# Patient Record
Sex: Male | Born: 1945 | Race: Black or African American | Hispanic: No | Marital: Married | Smoking: Former smoker
Health system: Southern US, Community
[De-identification: ages and names within clinical notes are randomized; demographics above are authoritative.]

## PROBLEM LIST (undated history)

## (undated) DIAGNOSIS — K219 Gastro-esophageal reflux disease without esophagitis: Secondary | ICD-10-CM

## (undated) DIAGNOSIS — H3320 Serous retinal detachment, unspecified eye: Secondary | ICD-10-CM

## (undated) DIAGNOSIS — H2640 Unspecified secondary cataract: Secondary | ICD-10-CM

## (undated) DIAGNOSIS — C61 Malignant neoplasm of prostate: Secondary | ICD-10-CM

## (undated) DIAGNOSIS — E11319 Type 2 diabetes mellitus with unspecified diabetic retinopathy without macular edema: Secondary | ICD-10-CM

## (undated) DIAGNOSIS — E119 Type 2 diabetes mellitus without complications: Secondary | ICD-10-CM

## (undated) DIAGNOSIS — E538 Deficiency of other specified B group vitamins: Secondary | ICD-10-CM

## (undated) DIAGNOSIS — H539 Unspecified visual disturbance: Secondary | ICD-10-CM

## (undated) DIAGNOSIS — R809 Proteinuria, unspecified: Secondary | ICD-10-CM

## (undated) DIAGNOSIS — E11649 Type 2 diabetes mellitus with hypoglycemia without coma: Secondary | ICD-10-CM

## (undated) DIAGNOSIS — I10 Essential (primary) hypertension: Secondary | ICD-10-CM

## (undated) DIAGNOSIS — R972 Elevated prostate specific antigen [PSA]: Principal | ICD-10-CM

## (undated) DIAGNOSIS — Z961 Presence of intraocular lens: Secondary | ICD-10-CM

## (undated) DIAGNOSIS — E785 Hyperlipidemia, unspecified: Secondary | ICD-10-CM

## (undated) DIAGNOSIS — N529 Male erectile dysfunction, unspecified: Secondary | ICD-10-CM

## (undated) HISTORY — DX: Type 2 diabetes mellitus without complications: E11.9

## (undated) HISTORY — DX: Essential (primary) hypertension: I10

## (undated) HISTORY — DX: Unspecified secondary cataract: H26.40

## (undated) HISTORY — DX: Serous retinal detachment, unspecified eye: H33.20

## (undated) HISTORY — DX: Type 2 diabetes mellitus with hypoglycemia without coma: E11.649

## (undated) HISTORY — DX: Type 2 diabetes mellitus with unspecified diabetic retinopathy without macular edema: E11.319

## (undated) HISTORY — DX: Proteinuria, unspecified: R80.9

## (undated) HISTORY — DX: Elevated prostate specific antigen (PSA): R97.20

## (undated) HISTORY — PX: COLONOSCOPY: SHX174

## (undated) HISTORY — DX: Hyperlipidemia, unspecified: E78.5

## (undated) HISTORY — DX: Presence of intraocular lens: Z96.1

## (undated) HISTORY — DX: Deficiency of other specified B group vitamins: E53.8

---

## 2008-01-04 ENCOUNTER — Other Ambulatory Visit: Payer: Self-pay

## 2008-01-04 ENCOUNTER — Emergency Department: Payer: Self-pay | Admitting: Emergency Medicine

## 2008-01-05 ENCOUNTER — Ambulatory Visit: Payer: Self-pay | Admitting: Specialist

## 2008-11-10 HISTORY — PX: RETINAL DETACHMENT SURGERY: SHX105

## 2009-11-10 HISTORY — PX: EYE SURGERY: SHX253

## 2010-05-24 ENCOUNTER — Ambulatory Visit: Payer: Self-pay | Admitting: Ophthalmology

## 2010-09-04 ENCOUNTER — Ambulatory Visit: Payer: Self-pay | Admitting: Ophthalmology

## 2010-12-11 ENCOUNTER — Ambulatory Visit: Payer: Self-pay | Admitting: Ophthalmology

## 2010-12-12 ENCOUNTER — Ambulatory Visit: Payer: Self-pay | Admitting: Ophthalmology

## 2012-03-21 HISTORY — PX: EYE SURGERY: SHX253

## 2013-03-17 DIAGNOSIS — E785 Hyperlipidemia, unspecified: Secondary | ICD-10-CM

## 2013-03-17 DIAGNOSIS — I10 Essential (primary) hypertension: Secondary | ICD-10-CM

## 2013-03-17 DIAGNOSIS — E139 Other specified diabetes mellitus without complications: Secondary | ICD-10-CM | POA: Insufficient documentation

## 2013-03-17 HISTORY — DX: Essential (primary) hypertension: I10

## 2013-03-17 HISTORY — DX: Hyperlipidemia, unspecified: E78.5

## 2013-03-21 HISTORY — PX: EYE SURGERY: SHX253

## 2013-03-22 DIAGNOSIS — Z961 Presence of intraocular lens: Secondary | ICD-10-CM

## 2013-03-22 HISTORY — DX: Presence of intraocular lens: Z96.1

## 2013-04-07 DIAGNOSIS — H3320 Serous retinal detachment, unspecified eye: Secondary | ICD-10-CM

## 2013-04-07 HISTORY — DX: Serous retinal detachment, unspecified eye: H33.20

## 2013-11-10 HISTORY — PX: PSEUDOANEURYSM REPAIR: SHX2272

## 2014-01-17 DIAGNOSIS — H2640 Unspecified secondary cataract: Secondary | ICD-10-CM

## 2014-01-17 HISTORY — DX: Unspecified secondary cataract: H26.40

## 2014-06-29 DIAGNOSIS — E119 Type 2 diabetes mellitus without complications: Secondary | ICD-10-CM

## 2014-06-29 DIAGNOSIS — Z794 Long term (current) use of insulin: Secondary | ICD-10-CM | POA: Insufficient documentation

## 2014-06-29 DIAGNOSIS — R809 Proteinuria, unspecified: Secondary | ICD-10-CM

## 2014-06-29 DIAGNOSIS — E11649 Type 2 diabetes mellitus with hypoglycemia without coma: Secondary | ICD-10-CM

## 2014-06-29 HISTORY — DX: Type 2 diabetes mellitus with hypoglycemia without coma: E11.649

## 2014-06-29 HISTORY — DX: Type 2 diabetes mellitus without complications: E11.9

## 2014-06-29 HISTORY — DX: Proteinuria, unspecified: R80.9

## 2014-09-21 DIAGNOSIS — E11319 Type 2 diabetes mellitus with unspecified diabetic retinopathy without macular edema: Secondary | ICD-10-CM

## 2014-09-21 HISTORY — DX: Type 2 diabetes mellitus with unspecified diabetic retinopathy without macular edema: E11.319

## 2014-11-24 ENCOUNTER — Ambulatory Visit: Payer: Self-pay | Admitting: Gastroenterology

## 2015-03-05 LAB — SURGICAL PATHOLOGY

## 2017-03-17 DIAGNOSIS — R972 Elevated prostate specific antigen [PSA]: Secondary | ICD-10-CM

## 2017-03-17 HISTORY — DX: Elevated prostate specific antigen (PSA): R97.20

## 2017-09-30 ENCOUNTER — Other Ambulatory Visit: Payer: Self-pay | Admitting: Family Medicine

## 2017-09-30 DIAGNOSIS — Z136 Encounter for screening for cardiovascular disorders: Secondary | ICD-10-CM

## 2017-09-30 DIAGNOSIS — Z87891 Personal history of nicotine dependence: Secondary | ICD-10-CM

## 2017-10-07 ENCOUNTER — Ambulatory Visit
Admission: RE | Admit: 2017-10-07 | Discharge: 2017-10-07 | Disposition: A | Discharge status: Home / Self Care | Payer: Medicare HMO | Source: Ambulatory Visit | Attending: Family Medicine | Admitting: Family Medicine

## 2017-10-07 DIAGNOSIS — Z87891 Personal history of nicotine dependence: Secondary | ICD-10-CM

## 2017-10-07 DIAGNOSIS — Z136 Encounter for screening for cardiovascular disorders: Secondary | ICD-10-CM

## 2017-10-23 ENCOUNTER — Encounter: Payer: Self-pay | Admitting: Urology

## 2017-10-23 ENCOUNTER — Ambulatory Visit (INDEPENDENT_AMBULATORY_CARE_PROVIDER_SITE_OTHER): Payer: Medicare HMO | Admitting: Urology

## 2017-10-23 VITALS — BP 115/65 | HR 75 | Ht 69.0 in | Wt 174.0 lb

## 2017-10-23 DIAGNOSIS — N402 Nodular prostate without lower urinary tract symptoms: Secondary | ICD-10-CM | POA: Diagnosis not present

## 2017-10-23 DIAGNOSIS — E538 Deficiency of other specified B group vitamins: Secondary | ICD-10-CM

## 2017-10-23 DIAGNOSIS — R972 Elevated prostate specific antigen [PSA]: Secondary | ICD-10-CM | POA: Diagnosis not present

## 2017-10-23 HISTORY — DX: Deficiency of other specified B group vitamins: E53.8

## 2017-10-23 NOTE — Progress Notes (Signed)
10/23/2017 3:00 PM   Jonathan Gonzales 02-19-1946 [id number]  Referring provider: Alanson Aly, [place], [place] [address]  Chief Complaint  Patient presents with  . Elevated PSA    New Patient    HPI: 71 year old male referred for further evaluation of elevated PSA.  He underwent routine annual PSA 5/18 & his PSA was 4.57.  He underwent subsequent repeat PSA in 09/2017 which time his PSA had risen to 5.54.  He does not previously recall being told his PSA is elevated.  He has no previous PSAs for review.  He has not had a rectal exam in quite some time.     He denies any baseline urinary symptoms.  He feels like he is an excellent stream is able to empty completely.  No urgency or frequency.  No nocturia.  No UTIs or gross hematuria.  No weight loss or bone pain.  No family history of prostate cancer.    His primary medical problems include DM, HL.  He takes ASA 325 mg for prophylaxis.     PMH: Past Medical History:  Diagnosis Date  . After cataract 01/17/2014   Last Assessment & Plan:  Discussed rba of yag cap laser, pt interesting in proceeding today Pre-cert done, informed conset obtained and info dispensed to pt and wife Uncomplicated yag done today  PF BID OD x 2 weeks  rtc iop check and vision check 2 weeks  Last Assessment & Plan:  2 weeks s/p PCIOL Very happy with vision OD and no sequelae  Pt asks to have OS looked at [facility] in detail (last seen M  . Diabetic retinopathy associated with type 2 diabetes mellitus ([place]) 09/21/2014  . Elevated PSA 03/17/2017  . Hyperlipidemia, unspecified 03/17/2013   Overview:  Treated with atorvastatin  . Hypertension 03/17/2013   Overview:  Treated with amlodipine, metoprolol, and valsartan-HCTZ, diagnosed 1980's.  . Hypoglycemia associated with diabetes ([place]) 06/29/2014  . Microalbuminuria 06/29/2014  . Pseudophakia, both eyes 03/22/2013  . Retinal detachment 04/07/2013   Overview:  S/p PPV OS 2011  . Type II or  unspecified type diabetes mellitus without mention of complication, not stated as uncontrolled 06/29/2014  . Vitamin B12 deficiency 10/23/2017    Surgical History: Past Surgical History:  Procedure Laterality Date  . PSEUDOANEURYSM REPAIR  2015  . RETINAL DETACHMENT SURGERY  2010    Home Medications:  Allergies as of 10/23/2017   No Known Allergies     Medication List        Accurate as of 10/23/17  3:00 PM. Always use your most recent med list.          amLODipine 5 MG tablet Commonly known as:  NORVASC TK 1 T PO QD   aspirin EC 325 MG tablet Take by mouth.   atorvastatin 40 MG tablet Commonly known as:  LIPITOR TK 1 T PO QD   metFORMIN 1000 MG tablet Commonly known as:  GLUCOPHAGE TAKE 1 TABLET BY MOUTH TWICE DAILY WITH MEALS   metoprolol succinate 100 MG 24 hr tablet Commonly known as:  TOPROL-XL TK 1 T PO QD   NOVOLOG MIX 70/30 FLEXPEN (70-30) 100 UNIT/ML FlexPen Generic drug:  insulin aspart protamine - aspart INJ 23 UNITS [place] BID   valsartan-hydrochlorothiazide 160-25 MG tablet Commonly known as:  DIOVAN-HCT TK 1 T PO QD       Allergies: No Known Allergies  Family History: Family History  Problem Relation Age of Onset  . Diabetes  Mother   . Heart failure Mother   . Colon cancer Father   . Hematuria Sister   . Kidney failure Sister     Social History:  reports that he has quit smoking. he has never used smokeless tobacco. He reports that he drinks alcohol. He reports that he does not use drugs.  ROS: UROLOGY Frequent Urination?: No Hard to postpone urination?: No Burning/pain with urination?: No Get up at night to urinate?: No Leakage of urine?: No Urine stream starts and stops?: Yes Trouble starting stream?: No Do you have to strain to urinate?: No Blood in urine?: No Urinary tract infection?: No Sexually transmitted disease?: No Injury to kidneys or bladder?: No Painful intercourse?: No Weak stream?: No Erection problems?:  No Penile pain?: No  Gastrointestinal Nausea?: No Vomiting?: No Indigestion/heartburn?: Yes Diarrhea?: No Constipation?: No  Constitutional Fever: No Night sweats?: No Weight loss?: No Fatigue?: No  Skin Skin rash/lesions?: No Itching?: No  Eyes Blurred vision?: Yes Double vision?: No  Ears/Nose/Throat Sore throat?: No Sinus problems?: No  Hematologic/Lymphatic Swollen glands?: No Easy bruising?: Yes  Cardiovascular Leg swelling?: No Chest pain?: No  Respiratory Cough?: No Shortness of breath?: No  Endocrine Excessive thirst?: No  Musculoskeletal Back pain?: Yes Joint pain?: Yes  Neurological Headaches?: No Dizziness?: No  Psychologic Depression?: No Anxiety?: No  Physical Exam: BP 115/65   Pulse 75   Ht 5\' 9"  (1.753 m)   Wt 174 lb (78.9 kg)   BMI 25.70 kg/m   Constitutional:  Alert and oriented, No acute distress. HEENT: [place] AT, moist mucus membranes.  Trachea midline, no masses. Cardiovascular: No clubbing, cyanosis, or edema. Respiratory: Normal respiratory effort, no increased work of breathing. GI: Abdomen is soft, nontender, nondistended, no abdominal masses GU: No CVA tenderness.   Bowel: Normal sphincter tone.  Enlarged 40+ cc prostate with nodule at left base as well as small nodule at midline base. Skin: No rashes, bruises or suspicious lesions. Neurologic: Grossly intact, no focal deficits, moving all 4 extremities. Psychiatric: Normal mood and affect.  Laboratory Data: Lab 09/2017 Cr 1.1 Hbg 13.2 A1c 6.8 PSA as above  Urinalysis  n/a  Pertinent Imaging: N/a  Assessment & Plan:    1. Elevated PSA  We reviewed the implications of an elevated PSA and the uncertainty surrounding it. In general, a man's PSA increases with age and is produced by both normal and cancerous prostate tissue. Differential for elevated PSA is BPH, prostate cancer, infection, recent intercourse/ejaculation, prostate infarction, recent  urethroscopic manipulation (foley placement/cystoscopy) and prostatitis. Management of an elevated PSA can include observation or prostate biopsy and wediscussed this in detail. We discussed that indications for prostate biopsy are defined by age and race specific PSA cutoffs as well as a PSA velocity of 0.75/year.  Given his elevated and rising PSA along with abnormal DRE, I would strongly recommend prostate biopsy.  We discussed prostate biopsy in detail including the procedure itself, the risks of blood in the urine, stool, and ejaculate, serious infection, and discomfort. He is willing to proceed with this as discussed.  He will hold his aspirin for procedure  2. Prostate nodule As above   Schedule prostate biopsy  Hollice Espy, MD  [facility] [address] [place], [place] [address] [phone]

## 2017-11-24 ENCOUNTER — Other Ambulatory Visit: Payer: Self-pay | Admitting: Urology

## 2017-11-24 ENCOUNTER — Ambulatory Visit: Payer: Medicare HMO | Admitting: Urology

## 2017-11-24 ENCOUNTER — Encounter: Payer: Self-pay | Admitting: Urology

## 2017-11-24 VITALS — BP 148/54 | HR 81 | Ht 69.0 in | Wt 175.0 lb

## 2017-11-24 DIAGNOSIS — R972 Elevated prostate specific antigen [PSA]: Secondary | ICD-10-CM

## 2017-11-24 DIAGNOSIS — N402 Nodular prostate without lower urinary tract symptoms: Secondary | ICD-10-CM

## 2017-11-24 MED ORDER — GENTAMICIN SULFATE 40 MG/ML IJ SOLN
80.0000 mg | Freq: Once | INTRAMUSCULAR | Status: AC
  Administered 2017-11-24: 80 mg via INTRAMUSCULAR

## 2017-11-24 MED ORDER — LEVOFLOXACIN 500 MG PO TABS
500.0000 mg | ORAL_TABLET | Freq: Once | ORAL | Status: AC
  Administered 2017-11-24: 500 mg via ORAL

## 2017-11-24 NOTE — Progress Notes (Signed)
   11/24/17  CC:  Chief Complaint  Patient presents with  . Prostate Biopsy    HPI: 72 year old male with an elevated PSA to 5.54.  He also has a prostate nodule at the left base and in the midline base highly concerning for prostate cancer.  He presents to the office today for biopsy.  Blood pressure (!) 148/54, pulse 81, height 5\' 9"  (1.753 m), weight 175 lb (79.4 kg). NED. A&Ox3.   No respiratory distress   Abd soft, NT, ND Normal sphincter tone  Prostate Biopsy Procedure   Informed consent was obtained after discussing risks/benefits of the procedure.  A time out was performed to ensure correct patient identity.  Pre-Procedure: - Gentamicin given prophylactically - Levaquin 500 mg administered PO -Transrectal Ultrasound performed revealing a 53 gm prostate -Several hypoechoic lesions diffusely throughout the prostate.  No discrete median lobe.  Procedure: - Prostate block performed using 10 cc 1% lidocaine and biopsies taken from sextant areas, a total of 12 under ultrasound guidance.  Post-Procedure: - Patient tolerated the procedure well - He was counseled to seek immediate medical attention if experiences any severe pain, significant bleeding, or fevers - Return in one week to discuss biopsy results  Assessment/ Plan:  1. Elevated PSA As above Return to care to discuss results - levofloxacin (LEVAQUIN) tablet 500 mg - gentamicin (GARAMYCIN) injection 80 mg  2. Prostate nodule As above   Hollice Espy, MD

## 2017-11-30 ENCOUNTER — Other Ambulatory Visit: Payer: Self-pay | Admitting: Urology

## 2017-12-01 LAB — PATHOLOGY REPORT

## 2017-12-04 ENCOUNTER — Encounter: Payer: Self-pay | Admitting: Urology

## 2017-12-04 ENCOUNTER — Ambulatory Visit (INDEPENDENT_AMBULATORY_CARE_PROVIDER_SITE_OTHER): Payer: Medicare HMO | Admitting: Urology

## 2017-12-04 VITALS — BP 123/67 | HR 70 | Ht 69.0 in | Wt 175.0 lb

## 2017-12-04 DIAGNOSIS — C61 Malignant neoplasm of prostate: Secondary | ICD-10-CM

## 2017-12-04 NOTE — Progress Notes (Signed)
12/04/2017 4:41 PM   Jonathan Gonzales [birth year] [id number]  Referring provider: Alanson Aly, [place], [place] [address]  Chief Complaint  Patient presents with  . Prostate Cancer    biopsy results    HPI: 72 year old male who returns today to the office to discuss his new diagnosis of prostate cancer.  He initially presented with an elevated PSA to 5.54.  He did have an abnormal rectal exam with a suspicious nodule.  TRUS volume 53 g.  He underwent prostate biopsy on 11/25/2017 revealing 3 cores of Gleason 3+3 up to 10%.  It was located at the apex bilaterally as well as one core of the left lateral mid prostate.  No issues following prostate biopsy.  His voiding is at baseline.  Hematuria has resolved.   No urinary symptoms.  Minimal baseline ED.  IPSS and shim as below.   IPSS    Row Name 12/04/17 1400         International Prostate Symptom Score   How often have you had the sensation of not emptying your bladder?  Not at All     How often have you had to urinate less than every two hours?  Not at All     How often have you found you stopped and started again several times when you urinated?  Not at All     How often have you found it difficult to postpone urination?  Not at All     How often have you had a weak urinary stream?  Less than 1 in 5 times     How often have you had to strain to start urination?  Not at All     How many times did you typically get up at night to urinate?  None     Total IPSS Score  1       Quality of Life due to urinary symptoms   If you were to spend the rest of your life with your urinary condition just the way it is now how would you feel about that?  Delighted        Score:  1-7 Mild 8-19 Moderate 20-35 Severe  SHIM    Row Name 12/04/17 1436         SHIM: Over the last 6 months:   How do you rate your confidence that you could get and keep an erection?  Moderate     When you  had erections with sexual stimulation, how often were your erections hard enough for penetration (entering your partner)?  Sometimes (about half the time)     During sexual intercourse, how often were you able to maintain your erection after you had penetrated (entered) your partner?  Sometimes (about half the time)     During sexual intercourse, how difficult was it to maintain your erection to completion of intercourse?  Slightly Difficult     When you attempted sexual intercourse, how often was it satisfactory for you?  Almost Always or Always       SHIM Total Score   SHIM  18          PMH: Past Medical History:  Diagnosis Date  . After cataract 01/17/2014   Last Assessment & Plan:  Discussed rba of yag cap laser, pt interesting in proceeding today Pre-cert done, informed conset obtained and info dispensed to pt and wife Uncomplicated yag done today  PF BID OD x 2 weeks  rtc  iop check and vision check 2 weeks  Last Assessment & Plan:  2 weeks s/p PCIOL Very happy with vision OD and no sequelae  Pt asks to have OS looked at [facility] in detail (last seen M  . Diabetic retinopathy associated with type 2 diabetes mellitus ([place]) 09/21/2014  . Elevated PSA 03/17/2017  . Hyperlipidemia, unspecified 03/17/2013   Overview:  Treated with atorvastatin  . Hypertension 03/17/2013   Overview:  Treated with amlodipine, metoprolol, and valsartan-HCTZ, diagnosed 1980's.  . Hypoglycemia associated with diabetes ([place]) 06/29/2014  . Microalbuminuria 06/29/2014  . Pseudophakia, both eyes 03/22/2013  . Retinal detachment 04/07/2013   Overview:  S/p PPV OS 2011  . Type II or unspecified type diabetes mellitus without mention of complication, not stated as uncontrolled 06/29/2014  . Vitamin B12 deficiency 10/23/2017    Surgical History: Past Surgical History:  Procedure Laterality Date  . PSEUDOANEURYSM REPAIR  2015  . RETINAL DETACHMENT SURGERY  2010    Home Medications:  Allergies as of 12/04/2017   No Known  Allergies     Medication List        Accurate as of 12/04/17  4:41 PM. Always use your most recent med list.          amLODipine 5 MG tablet Commonly known as:  NORVASC TK 1 T PO QD   aspirin EC 325 MG tablet Take by mouth.   atorvastatin 40 MG tablet Commonly known as:  LIPITOR TK 1 T PO QD   metFORMIN 1000 MG tablet Commonly known as:  GLUCOPHAGE TAKE 1 TABLET BY MOUTH TWICE DAILY WITH MEALS   metoprolol succinate 100 MG 24 hr tablet Commonly known as:  TOPROL-XL TK 1 T PO QD   NOVOLOG MIX 70/30 FLEXPEN (70-30) 100 UNIT/ML FlexPen Generic drug:  insulin aspart protamine - aspart INJ 23 UNITS [place] BID   valsartan-hydrochlorothiazide 160-25 MG tablet Commonly known as:  DIOVAN-HCT TK 1 T PO QD       Allergies: No Known Allergies  Family History: Family History  Problem Relation Age of Onset  . Diabetes Mother   . Heart failure Mother   . Colon cancer Father   . Hematuria Sister   . Kidney failure Sister     Social History:  reports that he has quit smoking. he has never used smokeless tobacco. He reports that he drinks alcohol. He reports that he does not use drugs.  ROS: UROLOGY Frequent Urination?: No Hard to postpone urination?: No Burning/pain with urination?: No Get up at night to urinate?: No Leakage of urine?: No Urine stream starts and stops?: No Trouble starting stream?: No Do you have to strain to urinate?: No Blood in urine?: No Urinary tract infection?: No Sexually transmitted disease?: No Injury to kidneys or bladder?: No Painful intercourse?: No Weak stream?: No Erection problems?: No Penile pain?: No  Gastrointestinal Nausea?: No Vomiting?: No Indigestion/heartburn?: No Diarrhea?: No Constipation?: No  Constitutional Fever: No Night sweats?: No Weight loss?: No Fatigue?: No  Skin Skin rash/lesions?: No Itching?: No  Eyes Blurred vision?: No Double vision?: No  Ears/Nose/Throat Sore throat?: No Sinus  problems?: No  Hematologic/Lymphatic Swollen glands?: No Easy bruising?: No  Cardiovascular Leg swelling?: No Chest pain?: No  Respiratory Cough?: No Shortness of breath?: No  Endocrine Excessive thirst?: No  Musculoskeletal Back pain?: No  Neurological Headaches?: No Dizziness?: No  Psychologic Depression?: No Anxiety?: No  Physical Exam: BP 123/67   Pulse 70   Ht 5\' 9"  (1.753 m)  Wt 175 lb (79.4 kg)   BMI 25.84 kg/m   Constitutional:  Alert and oriented, No acute distress.  Company today by his wife. HEENT: [place] AT, moist mucus membranes.  Trachea midline, no masses. Cardiovascular: No clubbing, cyanosis, or edema. Respiratory: Normal respiratory effort, no increased work of breathing. Skin: No rashes, bruises or suspicious lesions. Neurologic: Grossly intact, no focal deficits, moving all 4 extremities. Psychiatric: Normal mood and affect.  Laboratory Data:   Urinalysis N/a  Pertinent Imaging: n/a  Assessment & Plan:    1. Prostate cancer [facility]) Newly diagnosed very low risk prostate cancer, Gleason 3+3, low volume  The patient was counseled about the natural history of prostate cancer and the standard treatment options that are available for prostate cancer. It was explained to him how his age and life expectancy, clinical stage, Gleason score, and PSA affect his prognosis, the decision to proceed with additional staging studies, as well as how that information influences recommended treatment strategies. We discussed the roles for active surveillance, radiation therapy, surgical therapy, androgen deprivation, as well as ablative therapy options for the treatment of prostate cancer as appropriate to his individual cancer situation. We discussed the risks and benefits of these options with regard to their impact on cancer control and also in terms of potential adverse events, complications, and impact on quality of life particularly related to urinary, bowel,  and sexual function. The patient was encouraged to ask questions throughout the discussion today and all questions were answered to his stated satisfaction. In addition, the patient was providedwith and/or directed to appropriate resources and literature for further education about prostate cancer treatment options.  Given his very low risk disease, I would recommend active surveillance.  We will plan for every 4 month PSA with occasional DRE for the first year.  Consider confirmatory biopsy 1 year especially given his abnormal rectal exam.  All questions answered.  He is very agreeable with this plan.  - PSA; Future   Return in about 4 months (around 04/03/2018) for PSA prior.  Hollice Espy, MD  [facility] [address], [place], [place] [address] [phone]

## 2018-04-02 ENCOUNTER — Ambulatory Visit: Payer: Medicare HMO | Admitting: Urology

## 2019-07-03 IMAGING — US US AORTA SCREENING (MEDICARE)
1 series · 14 of 25 positions shown · non-contrast
Comparison: None.

CLINICAL DATA: Male between 65-75 years of age with a smoking
history.

EXAM:
US ABDOMINAL AORTA MEDICARE SCREENING
TECHNIQUE: Ultrasound examination of the abdominal aorta was performed as a
screening evaluation for abdominal aortic aneurysm.

[Series 1: us aorta screening (medicare) · 0.31mm/px · 14 of 26 slices shown]
[im 1/26]
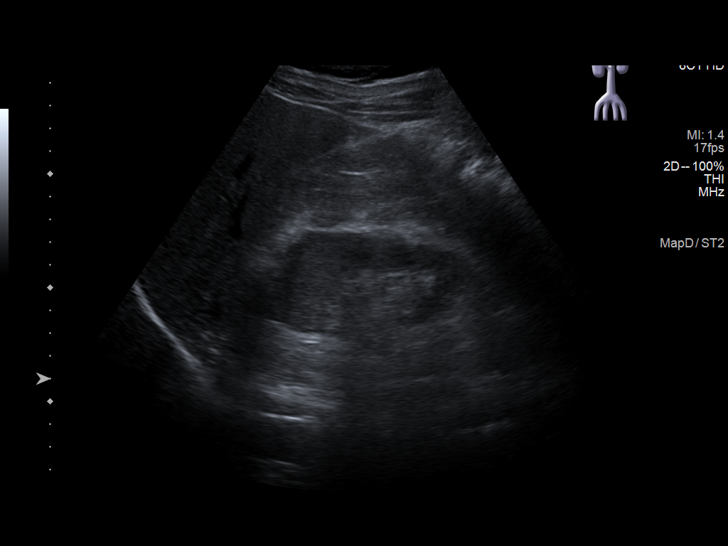
[im 3/26]
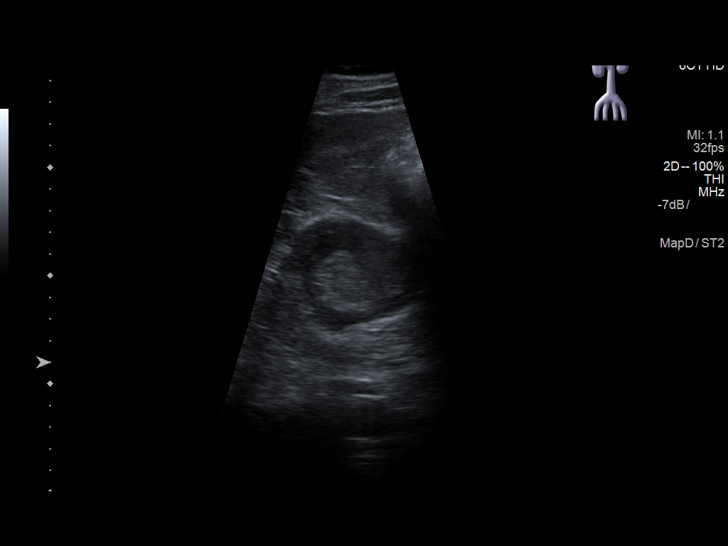
[im 5/26]
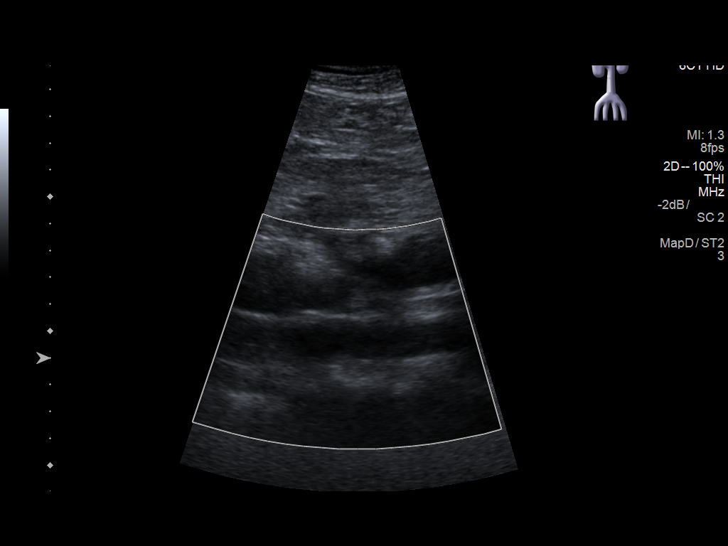
[im 7/26]
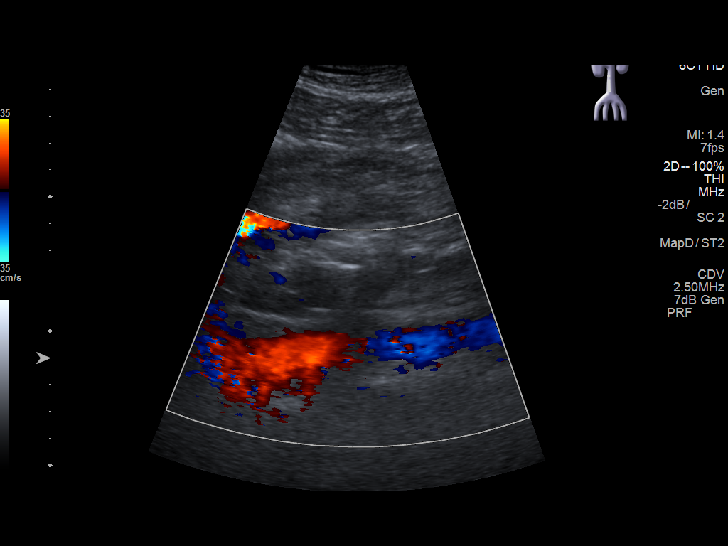
[im 9/26]
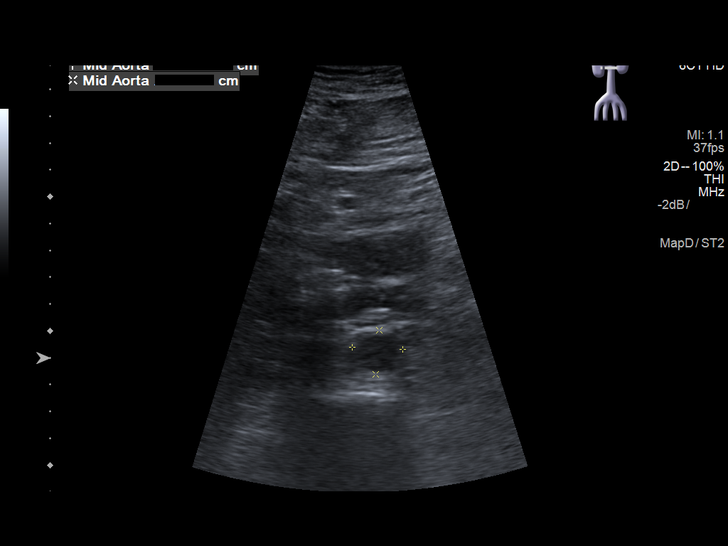
[im 10/26]
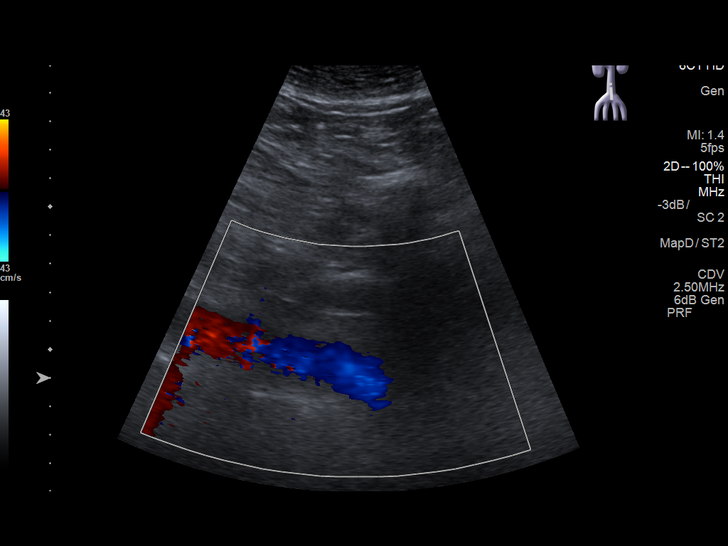
[im 12/26]
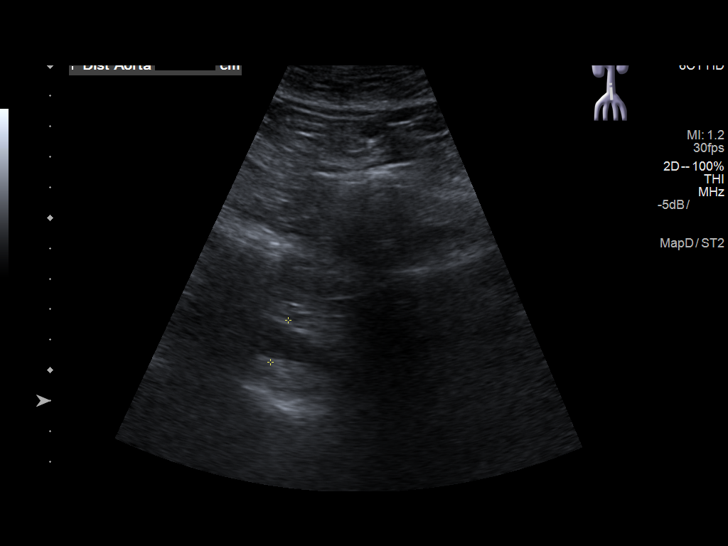
[im 14/26]
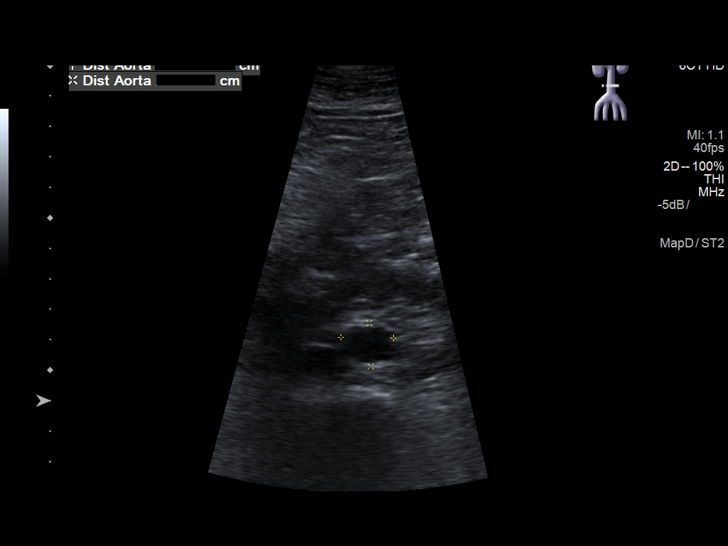
[im 16/26]
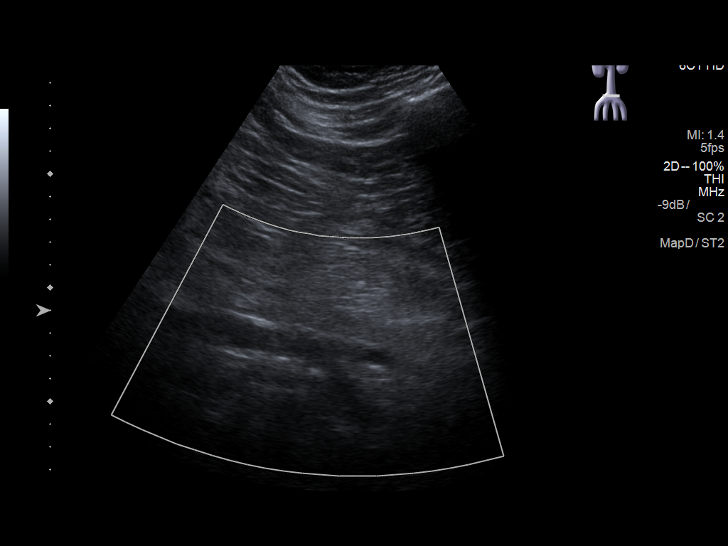
[im 17/26]
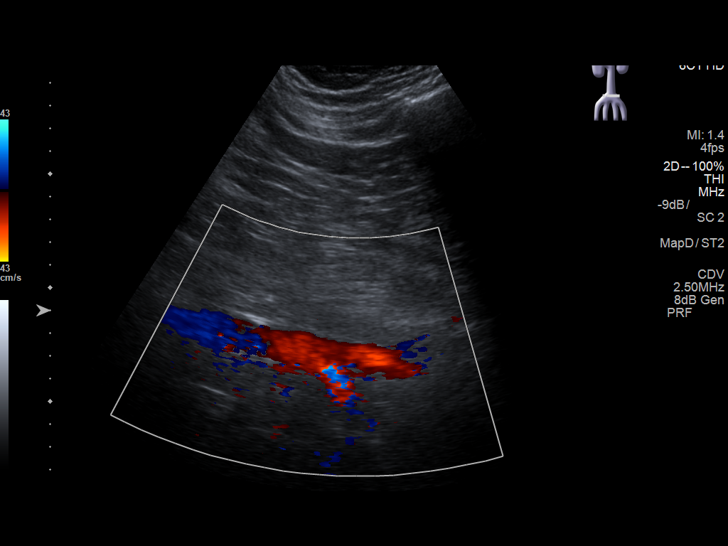
[im 19/26]
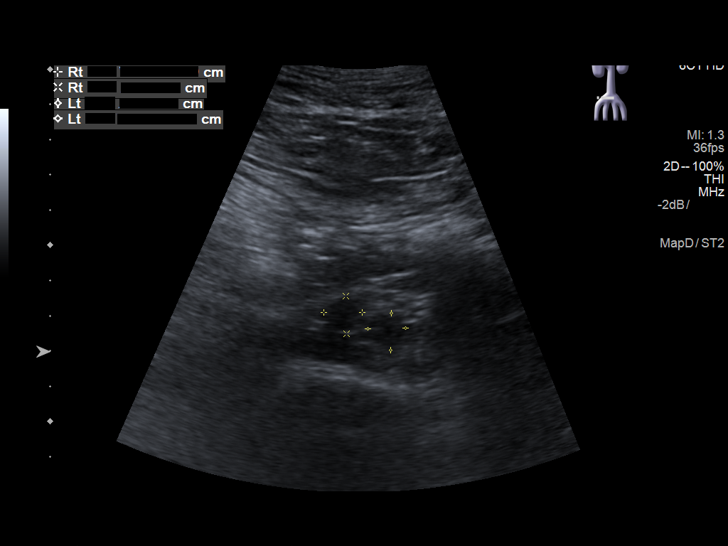
[im 21/26]
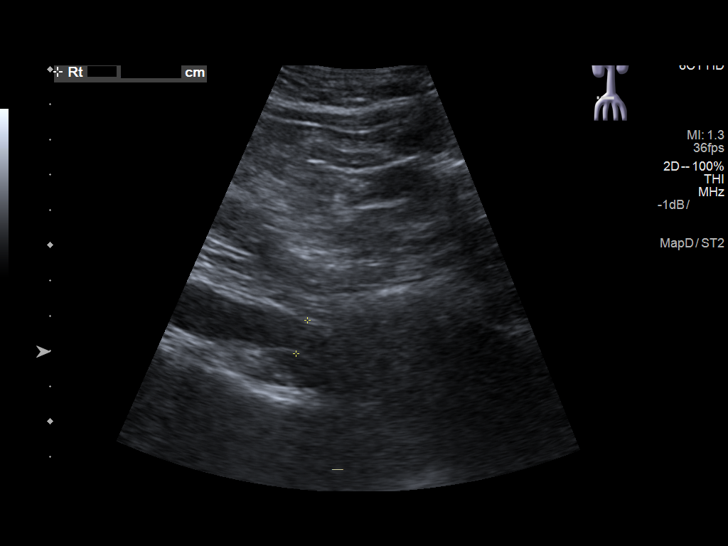
[im 23/26]
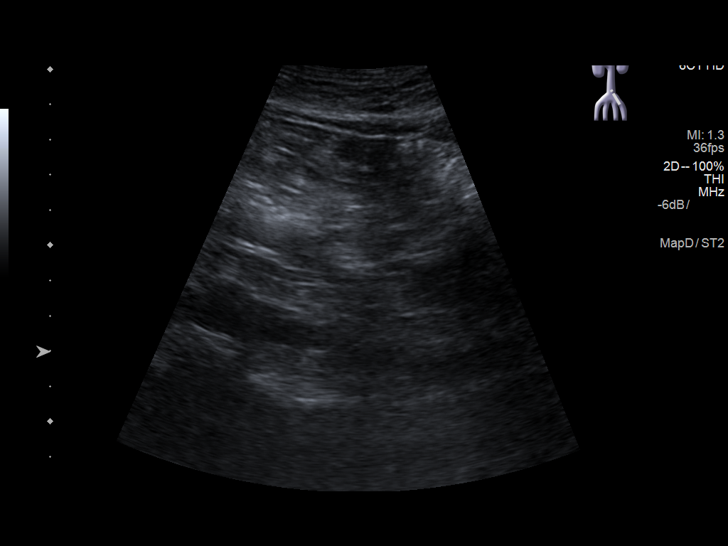
[im 26/26]
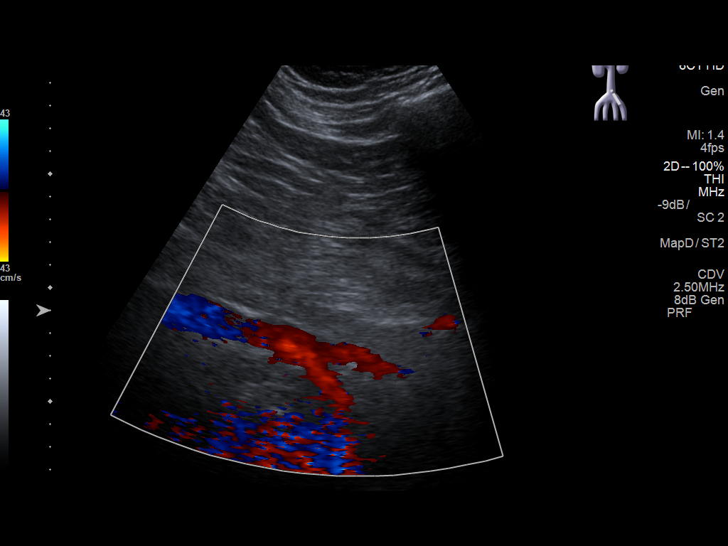

[14 of 25 positions shown; findings below may reference images not displayed]

FINDINGS: Abdominal aortic measurements as follows:

Proximal:  2.3 x 2.2 cm

Mid:  1.9 x 1.7 cm

Distal:  1.7 x 1.4 cm
IMPRESSION: No evidence of abdominal aortic aneurysm.

## 2020-04-02 ENCOUNTER — Other Ambulatory Visit
Admission: RE | Admit: 2020-04-02 | Discharge: 2020-04-02 | Disposition: A | Discharge status: Home / Self Care | Payer: Medicare HMO | Source: Ambulatory Visit | Attending: Internal Medicine | Admitting: Internal Medicine

## 2020-04-02 ENCOUNTER — Other Ambulatory Visit: Payer: Self-pay

## 2020-04-02 DIAGNOSIS — Z01812 Encounter for preprocedural laboratory examination: Secondary | ICD-10-CM | POA: Diagnosis present

## 2020-04-02 DIAGNOSIS — Z20822 Contact with and (suspected) exposure to covid-19: Secondary | ICD-10-CM | POA: Diagnosis not present

## 2020-04-02 LAB — SARS CORONAVIRUS 2 (TAT 6-24 HRS): SARS Coronavirus 2: NEGATIVE

## 2020-04-03 ENCOUNTER — Encounter: Payer: Self-pay | Admitting: Internal Medicine

## 2020-04-04 ENCOUNTER — Ambulatory Visit: Payer: Medicare HMO | Admitting: Certified Registered Nurse Anesthetist

## 2020-04-04 ENCOUNTER — Ambulatory Visit
Admission: RE | Admit: 2020-04-04 | Discharge: 2020-04-04 | Disposition: A | Discharge status: Home / Self Care | Payer: Medicare HMO | Attending: Internal Medicine | Admitting: Internal Medicine

## 2020-04-04 ENCOUNTER — Encounter
Admission: RE | Disposition: A | Discharge status: Home / Self Care | Payer: Medicare HMO | Source: Home / Self Care | Attending: Internal Medicine

## 2020-04-04 ENCOUNTER — Other Ambulatory Visit: Payer: Self-pay

## 2020-04-04 ENCOUNTER — Encounter: Payer: Self-pay | Admitting: Internal Medicine

## 2020-04-04 DIAGNOSIS — Z7982 Long term (current) use of aspirin: Secondary | ICD-10-CM | POA: Insufficient documentation

## 2020-04-04 DIAGNOSIS — I10 Essential (primary) hypertension: Secondary | ICD-10-CM | POA: Diagnosis not present

## 2020-04-04 DIAGNOSIS — K640 First degree hemorrhoids: Secondary | ICD-10-CM | POA: Diagnosis not present

## 2020-04-04 DIAGNOSIS — Z86010 Personal history of colonic polyps: Secondary | ICD-10-CM | POA: Insufficient documentation

## 2020-04-04 DIAGNOSIS — K219 Gastro-esophageal reflux disease without esophagitis: Secondary | ICD-10-CM | POA: Diagnosis not present

## 2020-04-04 DIAGNOSIS — Z794 Long term (current) use of insulin: Secondary | ICD-10-CM | POA: Insufficient documentation

## 2020-04-04 DIAGNOSIS — Z79899 Other long term (current) drug therapy: Secondary | ICD-10-CM | POA: Insufficient documentation

## 2020-04-04 DIAGNOSIS — E11319 Type 2 diabetes mellitus with unspecified diabetic retinopathy without macular edema: Secondary | ICD-10-CM | POA: Insufficient documentation

## 2020-04-04 DIAGNOSIS — E785 Hyperlipidemia, unspecified: Secondary | ICD-10-CM | POA: Insufficient documentation

## 2020-04-04 DIAGNOSIS — Z8546 Personal history of malignant neoplasm of prostate: Secondary | ICD-10-CM | POA: Insufficient documentation

## 2020-04-04 DIAGNOSIS — K635 Polyp of colon: Secondary | ICD-10-CM | POA: Insufficient documentation

## 2020-04-04 DIAGNOSIS — Z1211 Encounter for screening for malignant neoplasm of colon: Secondary | ICD-10-CM | POA: Insufficient documentation

## 2020-04-04 HISTORY — DX: Gastro-esophageal reflux disease without esophagitis: K21.9

## 2020-04-04 HISTORY — DX: Malignant neoplasm of prostate: C61

## 2020-04-04 HISTORY — DX: Unspecified visual disturbance: H53.9

## 2020-04-04 HISTORY — DX: Male erectile dysfunction, unspecified: N52.9

## 2020-04-04 HISTORY — PX: COLONOSCOPY WITH PROPOFOL: SHX5780

## 2020-04-04 LAB — GLUCOSE, CAPILLARY: Glucose-Capillary: 118 mg/dL — ABNORMAL HIGH (ref 70–99)

## 2020-04-04 SURGERY — COLONOSCOPY WITH PROPOFOL
Anesthesia: General

## 2020-04-04 MED ORDER — PROPOFOL 10 MG/ML IV BOLUS
INTRAVENOUS | Status: AC
  Filled 2020-04-04: qty 80

## 2020-04-04 MED ORDER — PHENYLEPHRINE HCL (PRESSORS) 10 MG/ML IV SOLN
INTRAVENOUS | Status: AC
  Filled 2020-04-04: qty 1

## 2020-04-04 MED ORDER — LIDOCAINE HCL (CARDIAC) PF 100 MG/5ML IV SOSY
PREFILLED_SYRINGE | INTRAVENOUS | Status: DC | PRN
  Administered 2020-04-04: 100 mg via INTRAVENOUS

## 2020-04-04 MED ORDER — SODIUM CHLORIDE 0.9 % IV SOLN: INTRAVENOUS | Status: DC

## 2020-04-04 MED ORDER — PROPOFOL 10 MG/ML IV BOLUS
INTRAVENOUS | Status: DC | PRN
Start: 2020-04-04 — End: 2020-04-04
  Administered 2020-04-04: 10 mg via INTRAVENOUS
  Administered 2020-04-04: 50 mg via INTRAVENOUS

## 2020-04-04 MED ORDER — PHENYLEPHRINE HCL (PRESSORS) 10 MG/ML IV SOLN
INTRAVENOUS | Status: DC | PRN
  Administered 2020-04-04: 100 ug via INTRAVENOUS

## 2020-04-04 MED ORDER — PROPOFOL 500 MG/50ML IV EMUL
INTRAVENOUS | Status: DC | PRN
  Administered 2020-04-04: 100 ug/kg/min via INTRAVENOUS

## 2020-04-04 MED ORDER — LIDOCAINE HCL (PF) 2 % IJ SOLN
INTRAMUSCULAR | Status: AC
  Filled 2020-04-04: qty 10

## 2020-04-04 NOTE — Anesthesia Postprocedure Evaluation (Signed)
Anesthesia Post Note  Patient: Jonathan Gonzales  Procedure(s) Performed: COLONOSCOPY WITH PROPOFOL (N/A )  Patient location during evaluation: Endoscopy Anesthesia Type: General Level of consciousness: awake and alert and oriented Pain management: pain level controlled Vital Signs Assessment: post-procedure vital signs reviewed and stable Respiratory status: spontaneous breathing, nonlabored ventilation and respiratory function stable Cardiovascular status: blood pressure returned to baseline and stable Postop Assessment: no signs of nausea or vomiting Anesthetic complications: no     Last Vitals:  Vitals:   04/04/20 0939 04/04/20 0949  BP:  (!) 141/77  Pulse: 76 73  Resp: 17 18  Temp:    SpO2: 99% 99%    Last Pain:  Vitals:   04/04/20 0949  TempSrc:   PainSc: 0-No pain                 Teylor Wolven

## 2020-04-04 NOTE — Transfer of Care (Signed)
Immediate Anesthesia Transfer of Care Note  Patient: Jonathan Gonzales  Procedure(s) Performed: COLONOSCOPY WITH PROPOFOL (N/A )  Patient Location: PACU and Endoscopy Unit  Anesthesia Type:General  Level of Consciousness: sedated and patient cooperative  Airway & Oxygen Therapy: Patient Spontanous Breathing  Post-op Assessment: Report given to RN, Post -op Vital signs reviewed and stable and Patient moving all extremities  Post vital signs: Reviewed and stable  Last Vitals:  Vitals Value Taken Time  BP 106/62 04/04/20 0919  Temp 36.1 C 04/04/20 0919  Pulse 74 04/04/20 0920  Resp 10 04/04/20 0920  SpO2 98 % 04/04/20 0920  Vitals shown include unvalidated device data.  Last Pain:  Vitals:   04/04/20 0919  TempSrc: Temporal  PainSc: 0-No pain         Complications: No apparent anesthesia complications

## 2020-04-04 NOTE — Interval H&P Note (Signed)
History and Physical Interval Note:  04/04/2020 9:00 AM  Jonathan Gonzales  has presented today for surgery, with the diagnosis of P HX COLON POLYPS.  The various methods of treatment have been discussed with the patient and family. After consideration of risks, benefits and other options for treatment, the patient has consented to  Procedure(s): COLONOSCOPY WITH PROPOFOL (N/A) as a surgical intervention.  The patient's history has been reviewed, patient examined, no change in status, stable for surgery.  I have reviewed the patient's chart and labs.  Questions were answered to the patient's satisfaction.     [place], [place]

## 2020-04-04 NOTE — Op Note (Signed)
[facility] Gastroenterology Patient Name: Jonathan Gonzales Procedure Date: 04/04/2020 8:28 AM MRN: [id number] Account #: [id number] Date of Birth: 11/16/1945 Admit Type: Outpatient Age: 74 Room: [facility] ENDO ROOM 3 Gender: Male Note Status: Finalized Procedure:             Colonoscopy Indications:           Surveillance: Personal history of adenomatous polyps                         on last colonoscopy > 3 years ago Providers:             Lorie Apley K. Alice Reichert MD, MD Referring MD:          Alanson Aly .,FNP Medicines:             Propofol per Anesthesia Complications:         No immediate complications. Procedure:             Pre-Anesthesia Assessment:                        - The risks and benefits of the procedure and the                         sedation options and risks were discussed with the                         patient. All questions were answered and informed                         consent was obtained.                        - Patient identification and proposed procedure were                         verified prior to the procedure by the nurse. The                         procedure was verified in the procedure room.                        - ASA Grade Assessment: III - A patient with severe                         systemic disease.                        - After reviewing the risks and benefits, the patient                         was deemed in satisfactory condition to undergo the                         procedure.                        After obtaining informed consent, the colonoscope was                         passed under direct vision. Throughout the  procedure,                         the patient's blood pressure, pulse, and oxygen                         saturations were monitored continuously. The                         Colonoscope was introduced through the anus and                         advanced to the the cecum, identified by  appendiceal                         orifice and ileocecal valve. The colonoscopy was                         performed without difficulty. The patient tolerated                         the procedure well. The quality of the bowel                         preparation was good. The ileocecal valve, appendiceal                         orifice, and rectum were photographed. Findings:      The perianal and digital rectal examinations were normal. Pertinent       negatives include normal sphincter tone and no palpable rectal lesions.      Two sessile polyps were found in the splenic flexure and transverse       colon. The polyps were 3 to 4 mm in size. These polyps were removed with       a jumbo cold forceps. Resection and retrieval were complete.      Non-bleeding internal hemorrhoids were found during retroflexion. The       hemorrhoids were Grade I (internal hemorrhoids that do not prolapse).      The exam was otherwise without abnormality. Impression:            - Two 3 to 4 mm polyps at the splenic flexure and in                         the transverse colon, removed with a jumbo cold                         forceps. Resected and retrieved.                        - Non-bleeding internal hemorrhoids.                        - The examination was otherwise normal. Recommendation:        - Patient has a contact number available for                         emergencies. The signs and symptoms of potential  delayed complications were discussed with the patient.                         Return to normal activities tomorrow. Written                         discharge instructions were provided to the patient.                        - Resume previous diet.                        - Continue present medications.                        - Repeat colonoscopy in 5 years for surveillance.                        - Return to GI office PRN.                        - The findings and  recommendations were discussed with                         the patient. Procedure Code(s):     --- Professional ---                        [phone], Colonoscopy, flexible; with biopsy, single or                         multiple Diagnosis Code(s):     --- Professional ---                        K64.0, First degree hemorrhoids                        K63.5, Polyp of colon                        Z86.010, Personal history of colonic polyps CPT copyright 2019 [employer]. All rights reserved. The codes documented in this report are preliminary and upon coder review may  be revised to meet current compliance requirements. Efrain Sella MD, MD 04/04/2020 9:19:29 AM This report has been signed electronically. Number of Addenda: 0 Note Initiated On: 04/04/2020 8:28 AM Scope Withdrawal Time: 0 hours 4 minutes 54 seconds  Total Procedure Duration: 0 hours 9 minutes 33 seconds  Estimated Blood Loss:  Estimated blood loss: none.      [facility]

## 2020-04-04 NOTE — Anesthesia Preprocedure Evaluation (Signed)
Anesthesia Evaluation  Patient identified by MRN, date of birth, ID band Patient awake    Reviewed: Allergy & Precautions, NPO status , Patient's Chart, lab work & pertinent test results  History of Anesthesia Complications Negative for: history of anesthetic complications  Airway Mallampati: II  TM Distance: >3 FB Neck ROM: Full    Dental no notable dental hx.    Pulmonary neg sleep apnea, neg COPD, former smoker,    breath sounds clear to auscultation- rhonchi (-) wheezing      Cardiovascular hypertension, Pt. on medications (-) CAD, (-) Past MI, (-) Cardiac Stents and (-) CABG  Rhythm:Regular Rate:Normal - Systolic murmurs and - Diastolic murmurs    Neuro/Psych neg Seizures negative neurological ROS  negative psych ROS   GI/Hepatic Neg liver ROS, GERD  ,  Endo/Other  diabetes, Oral Hypoglycemic Agents  Renal/GU negative Renal ROS     Musculoskeletal negative musculoskeletal ROS (+)   Abdominal (+) - obese,   Peds  Hematology negative hematology ROS (+)   Anesthesia Other Findings Past Medical History: 01/17/2014: After cataract     Comment:  Last Assessment & Plan:  Discussed rba of yag cap laser,              pt interesting in proceeding today Pre-cert done,               informed conset obtained and info dispensed to pt and               wife Uncomplicated yag done today  PF BID OD x 2 weeks                rtc iop check and vision check 2 weeks  Last Assessment &              Plan:  2 weeks s/p PCIOL Very happy with vision OD and no              sequelae  Pt asks to have OS looked at [facility] in detail               (last seen M 09/21/2014: Diabetic retinopathy associated with type 2 diabetes  mellitus ([place]) No date: Diabetic retinopathy associated with type 2 diabetes  mellitus ([place]) No date: ED (erectile dysfunction) 03/17/2017: Elevated PSA No date: GERD (gastroesophageal reflux disease) 03/17/2013:  Hyperlipidemia, unspecified     Comment:  Overview:  Treated with atorvastatin 03/17/2013: Hypertension     Comment:  Overview:  Treated with amlodipine, metoprolol, and               valsartan-HCTZ, diagnosed 1980's. 06/29/2014: Hypoglycemia associated with diabetes ([place]) 06/29/2014: Microalbuminuria No date: Prostate cancer ([place]) 03/22/2013: Pseudophakia, both eyes 04/07/2013: Retinal detachment     Comment:  Overview:  S/p PPV OS 2011 06/29/2014: Type II or unspecified type diabetes mellitus without  mention of complication, not stated as uncontrolled No date: Vision abnormalities 10/23/2017: Vitamin B12 deficiency   Reproductive/Obstetrics                             Anesthesia Physical Anesthesia Plan  ASA: II  Anesthesia Plan: General   Post-op Pain Management:    Induction: Intravenous  PONV Risk Score and Plan: 1 and Propofol infusion  Airway Management Planned: Natural Airway  Additional Equipment:   Intra-op Plan:   Post-operative Plan:   Informed Consent: I have reviewed the patients History and Physical, chart, labs  and discussed the procedure including the risks, benefits and alternatives for the proposed anesthesia with the patient or authorized representative who has indicated his/her understanding and acceptance.     Dental advisory given  Plan Discussed with: CRNA and Anesthesiologist  Anesthesia Plan Comments:         Anesthesia Quick Evaluation

## 2020-04-04 NOTE — H&P (Signed)
Outpatient short stay form Pre-procedure 04/04/2020 8:59 AM Taziyah Iannuzzi K. Alice Reichert, M.D.  Primary Physician:  Alanson Aly, FNP  Reason for visit:  Personal hx of adenomatous colon polyp x 10 Nov 2014  History of present illness:                            Patient presents for colonoscopy for a personal hx of colon polyps. The patient denies abdominal pain, abnormal weight loss or rectal bleeding.      Current Facility-Administered Medications:  .  0.9 %  sodium chloride infusion, , Intravenous, Continuous, Dabria Wadas, Benay Pike, MD  Medications Prior to Admission  Medication Sig Dispense Refill Last Dose  . aspirin EC 325 MG tablet Take by mouth.   Past Week at Unknown time  . CYANOCOBALAMIN PO Take by mouth daily.     Marland Kitchen amLODipine (NORVASC) 5 MG tablet TK 1 T PO QD  1 03/31/2020  . atorvastatin (LIPITOR) 40 MG tablet TK 1 T PO QD  1   . metFORMIN (GLUCOPHAGE) 1000 MG tablet TAKE 1 TABLET BY MOUTH TWICE DAILY WITH MEALS     . metoprolol succinate (TOPROL-XL) 100 MG 24 hr tablet TK 1 T PO QD  1 03/31/2020  . NOVOLOG MIX 70/30 FLEXPEN (70-30) 100 UNIT/ML FlexPen INJ 23 UNITS [place] BID  3   . valsartan-hydrochlorothiazide (DIOVAN-HCT) 160-25 MG tablet TK 1 T PO QD  1 03/31/2020     No Known Allergies   Past Medical History:  Diagnosis Date  . After cataract 01/17/2014   Last Assessment & Plan:  Discussed rba of yag cap laser, pt interesting in proceeding today Pre-cert done, informed conset obtained and info dispensed to pt and wife Uncomplicated yag done today  PF BID OD x 2 weeks  rtc iop check and vision check 2 weeks  Last Assessment & Plan:  2 weeks s/p PCIOL Very happy with vision OD and no sequelae  Pt asks to have OS looked at [facility] in detail (last seen M  . Diabetic retinopathy associated with type 2 diabetes mellitus ([place]) 09/21/2014  . Diabetic retinopathy associated with type 2 diabetes mellitus ([place])   . ED (erectile dysfunction)   . Elevated PSA 03/17/2017  . GERD (gastroesophageal  reflux disease)   . Hyperlipidemia, unspecified 03/17/2013   Overview:  Treated with atorvastatin  . Hypertension 03/17/2013   Overview:  Treated with amlodipine, metoprolol, and valsartan-HCTZ, diagnosed 1980's.  . Hypoglycemia associated with diabetes ([place]) 06/29/2014  . Microalbuminuria 06/29/2014  . Prostate cancer ([place])   . Pseudophakia, both eyes 03/22/2013  . Retinal detachment 04/07/2013   Overview:  S/p PPV OS 2011  . Type II or unspecified type diabetes mellitus without mention of complication, not stated as uncontrolled 06/29/2014  . Vision abnormalities   . Vitamin B12 deficiency 10/23/2017    Review of systems:  Otherwise negative.    Physical Exam  Gen: Alert, oriented. Appears stated age.  HEENT: /AT. PERRLA. Lungs: CTA, no wheezes. CV: RR nl S1, S2. Abd: soft, benign, no masses. BS+ Ext: No edema. Pulses 2+    Planned procedures: Proceed with colonoscopy. The patient understands the nature of the planned procedure, indications, risks, alternatives and potential complications including but not limited to bleeding, infection, perforation, damage to internal organs and possible oversedation/side effects from anesthesia. The patient agrees and gives consent to proceed.  Please refer to procedure notes for findings, recommendations and patient disposition/instructions.  Mikell Camp K. Alice Reichert, M.D. Gastroenterology 04/04/2020  8:59 AM

## 2020-04-05 ENCOUNTER — Encounter: Payer: Self-pay | Admitting: *Deleted

## 2020-04-05 LAB — SURGICAL PATHOLOGY
